# Patient Record
Sex: Male | Born: 1960 | Race: White | Hispanic: No | Marital: Single | Smoking: Current every day smoker
Health system: Southern US, Community
[De-identification: ages and names within clinical notes are randomized; demographics above are authoritative.]

## PROBLEM LIST (undated history)

## (undated) DIAGNOSIS — I10 Essential (primary) hypertension: Secondary | ICD-10-CM

## (undated) DIAGNOSIS — E785 Hyperlipidemia, unspecified: Secondary | ICD-10-CM

## (undated) HISTORY — DX: Essential (primary) hypertension: I10

## (undated) HISTORY — DX: Hyperlipidemia, unspecified: E78.5

---

## 2012-12-24 ENCOUNTER — Ambulatory Visit: Payer: Self-pay | Admitting: Nurse Practitioner

## 2012-12-25 ENCOUNTER — Encounter: Payer: Self-pay | Admitting: Nurse Practitioner

## 2012-12-25 ENCOUNTER — Ambulatory Visit (INDEPENDENT_AMBULATORY_CARE_PROVIDER_SITE_OTHER): Payer: 59 | Admitting: Nurse Practitioner

## 2012-12-25 VITALS — BP 146/84 | HR 71 | Temp 98.3°F | Ht 75.0 in | Wt 175.5 lb

## 2012-12-25 DIAGNOSIS — Z125 Encounter for screening for malignant neoplasm of prostate: Secondary | ICD-10-CM

## 2012-12-25 DIAGNOSIS — I10 Essential (primary) hypertension: Secondary | ICD-10-CM

## 2012-12-25 LAB — COMPLETE METABOLIC PANEL WITH GFR
ALT: 8 U/L (ref 0–53)
AST: 12 U/L (ref 0–37)
Albumin: 4.8 g/dL (ref 3.5–5.2)
Alkaline Phosphatase: 73 U/L (ref 39–117)
Calcium: 9.9 mg/dL (ref 8.4–10.5)
Chloride: 105 mEq/L (ref 96–112)
Potassium: 4.9 mEq/L (ref 3.5–5.3)

## 2012-12-25 MED ORDER — LISINOPRIL 20 MG PO TABS: 20.0000 mg | ORAL_TABLET | Freq: Every day | ORAL | Status: DC

## 2012-12-25 NOTE — Patient Instructions (Signed)

## 2012-12-25 NOTE — Progress Notes (Signed)
  Subjective:    Patient ID: Raymond Blake, male    DOB: 05-08-1961, 52 y.o.   MRN: [id number]  HPI  Patient here for DOT Physical several weeks ago and blood pressure 160 systolic- so DOT only good for 3 months- Here today to get on meds to control blood pressure.    Review of Systems  Constitutional: Negative for fatigue.  Respiratory: Negative for chest tightness and shortness of breath.   Cardiovascular: Negative for chest pain, palpitations and leg swelling.  Gastrointestinal: Negative.   Genitourinary: Negative.        Objective:   Physical Exam  Constitutional: He is oriented to person, place, and time. He appears well-developed and well-nourished.  HENT:  Head: Normocephalic.  Right Ear: External ear normal.  Left Ear: External ear normal.  Nose: Nose normal.  Mouth/Throat: Oropharynx is clear and moist.  Eyes: EOM are normal. Pupils are equal, round, and reactive to light.  Neck: Normal range of motion. Neck supple. No thyromegaly present.  Cardiovascular: Normal rate, regular rhythm, normal heart sounds and intact distal pulses.   No murmur heard. Pulmonary/Chest: Effort normal and breath sounds normal. He has no wheezes. He has no rales.  Abdominal: Soft. Bowel sounds are normal.  Musculoskeletal: Normal range of motion.  Neurological: He is alert and oriented to person, place, and time.  Skin: Skin is warm and dry.  Psychiatric: He has a normal mood and affect. His behavior is normal. Judgment and thought content normal.    BP 146/84  Pulse 71  Temp(Src) 98.3 F (36.8 C) (Oral)  Ht 6\' 3"  (1.905 m)  Wt 175 lb 8 oz (79.606 kg)  BMI 21.94 kg/m2       Assessment & Plan:   1. Hypertension   2. Screening for prostate cancer    Orders Placed This Encounter  Procedures  . COMPLETE METABOLIC PANEL WITH GFR  . NMR Lipoprofile with Lipids  . PSA   Meds ordered this encounter  Medications  . lisinopril (PRINIVIL,ZESTRIL) 20 MG tablet    Sig: Take 1 tablet  (20 mg total) by mouth daily.    Dispense:  30 tablet    Refill:  5    Order Specific Question:  Supervising Provider    Answer:  Ernestina Penna [[id number]]   Diet and exercise encouraged RTO in 2 weeks  Mary-Margaret Daphine Deutscher, FNP

## 2012-12-28 ENCOUNTER — Other Ambulatory Visit: Payer: Self-pay | Admitting: Nurse Practitioner

## 2012-12-28 LAB — NMR LIPOPROFILE WITH LIPIDS
Cholesterol, Total: 195 mg/dL (ref <200)
HDL Particle Number: 40.8 umol/L (ref >=30.5)
LDL (calc): 111 mg/dL — ABNORMAL HIGH (ref <100)
LDL Particle Number: 1622 nmol/L — ABNORMAL HIGH (ref <1000)
LP-IR Score: 53 — ABNORMAL HIGH (ref <=45)
Large VLDL-P: 1.9 nmol/L (ref <=2.7)
Small LDL Particle Number: 829 nmol/L — ABNORMAL HIGH (ref <=527)
Triglycerides: 110 mg/dL (ref <150)
VLDL Size: 46.7 nm — ABNORMAL HIGH (ref <=46.6)

## 2012-12-28 MED ORDER — ATORVASTATIN CALCIUM 40 MG PO TABS: 40.0000 mg | ORAL_TABLET | Freq: Every day | ORAL | Status: DC

## 2012-12-29 ENCOUNTER — Telehealth: Payer: Self-pay | Admitting: Nurse Practitioner

## 2012-12-29 NOTE — Telephone Encounter (Signed)
Patient notified of lab results

## 2013-01-08 ENCOUNTER — Encounter: Payer: Self-pay | Admitting: Nurse Practitioner

## 2013-01-08 ENCOUNTER — Ambulatory Visit (INDEPENDENT_AMBULATORY_CARE_PROVIDER_SITE_OTHER): Payer: 59 | Admitting: Nurse Practitioner

## 2013-01-08 VITALS — BP 118/79 | HR 66 | Temp 98.0°F | Ht 75.0 in | Wt 177.0 lb

## 2013-01-08 DIAGNOSIS — I10 Essential (primary) hypertension: Secondary | ICD-10-CM

## 2013-01-08 NOTE — Progress Notes (Addendum)
  Subjective:    Patient ID: Raymond Blake, male    DOB: [birth year], 52 y.o.   MRN: [id number]  HPI Patient here today for follow up of blood pressure. He had a DOT physical a few weeks back and  Was found to have high blood pressure. Was recently started on lisinopril 20 mg and is doing quite well.   Review of Systems  All other systems reviewed and are negative.       Objective:   Physical Exam  Constitutional: He is oriented to person, place, and time. He appears well-developed and well-nourished.  Cardiovascular: Normal rate, normal heart sounds and intact distal pulses.   Pulmonary/Chest: Effort normal and breath sounds normal.  Neurological: He is alert and oriented to person, place, and time.          Assessment & Plan:   1. Hypertension    Current Outpatient Prescriptions on File Prior to Visit  Medication Sig Dispense Refill  . atorvastatin (LIPITOR) 40 MG tablet Take 1 tablet (40 mg total) by mouth daily.  30 tablet  3  . lisinopril (PRINIVIL,ZESTRIL) 20 MG tablet Take 1 tablet (20 mg total) by mouth daily.  30 tablet  5   No current facility-administered medications on file prior to visit.   Continue all meds  Labs pending Diet and exercise Follow up in 3 months  Mary-Margaret Daphine Deutscher, FNP

## 2013-01-08 NOTE — Patient Instructions (Signed)

## 2013-02-24 ENCOUNTER — Encounter: Payer: Self-pay | Admitting: *Deleted

## 2013-04-08 ENCOUNTER — Telehealth: Payer: Self-pay | Admitting: Nurse Practitioner

## 2013-04-08 MED ORDER — VALSARTAN 160 MG PO TABS: 160.0000 mg | ORAL_TABLET | Freq: Every day | ORAL | Status: DC

## 2013-04-08 NOTE — Telephone Encounter (Signed)
Patient states it is causing him to cough and he cant stand to go out in  [place] sun he has an appointment in December can he wait till then or does he need to come in sooner?

## 2013-04-08 NOTE — Telephone Encounter (Signed)
ntbs

## 2013-04-08 NOTE — Telephone Encounter (Signed)
Changed to diovan

## 2013-04-09 NOTE — Telephone Encounter (Signed)
Patient aware.

## 2013-07-05 ENCOUNTER — Encounter: Payer: Self-pay | Admitting: Nurse Practitioner

## 2013-07-05 ENCOUNTER — Ambulatory Visit (INDEPENDENT_AMBULATORY_CARE_PROVIDER_SITE_OTHER): Payer: 59 | Admitting: Nurse Practitioner

## 2013-07-05 VITALS — BP 146/81 | HR 70 | Temp 99.1°F | Ht 75.0 in | Wt 185.0 lb

## 2013-07-05 DIAGNOSIS — E785 Hyperlipidemia, unspecified: Secondary | ICD-10-CM

## 2013-07-05 DIAGNOSIS — I10 Essential (primary) hypertension: Secondary | ICD-10-CM

## 2013-07-05 MED ORDER — LOSARTAN POTASSIUM-HCTZ 100-12.5 MG PO TABS: 1.0000 | ORAL_TABLET | Freq: Every day | ORAL | Status: DC

## 2013-07-05 NOTE — Progress Notes (Signed)
   Subjective:    Patient ID: Raymond Blake, male    DOB: 1961/07/09, 52 y.o.   MRN: [id number]  Hyperlipidemia This is a chronic problem. The current episode started more than 1 year ago. The problem is uncontrolled. Recent lipid tests were reviewed and are high. He has no history of diabetes, hypothyroidism, liver disease, obesity or nephrotic syndrome. There are no known factors aggravating his hyperlipidemia. Pertinent negatives include no chest pain, focal sensory loss, focal weakness, leg pain, myalgias or shortness of breath. Current antihyperlipidemic treatment includes statins. The current treatment provides moderate improvement of lipids. Compliance problems include adherence to diet.  Risk factors for coronary artery disease include dyslipidemia, hypertension and male sex.  Hypertension This is a chronic problem. The current episode started more than 1 year ago. The problem is unchanged. The problem is controlled. Pertinent negatives include no chest pain, neck pain, palpitations, peripheral edema, shortness of breath or sweats. There are no associated agents to hypertension. Risk factors for coronary artery disease include dyslipidemia and male gender. Past treatments include angiotensin blockers. Compliance problems include exercise.    * diovan very expensive would like something cheaper if possible   Review of Systems  Respiratory: Negative for shortness of breath.   Cardiovascular: Negative for chest pain and palpitations.  Musculoskeletal: Negative for myalgias and neck pain.  Neurological: Negative for focal weakness.       Objective:   Physical Exam  Constitutional: He is oriented to person, place, and time. He appears well-developed and well-nourished.  HENT:  Head: Normocephalic.  Right Ear: External ear normal.  Left Ear: External ear normal.  Nose: Nose normal.  Mouth/Throat: Oropharynx is clear and moist.  Eyes: EOM are normal. Pupils are equal, round, and reactive  to light.  Neck: Normal range of motion. Neck supple. No JVD present. No thyromegaly present.  Cardiovascular: Normal rate, regular rhythm, normal heart sounds and intact distal pulses.  Exam reveals no gallop and no friction rub.   No murmur heard. Pulmonary/Chest: Effort normal and breath sounds normal. No respiratory distress. He has no wheezes. He has no rales. He exhibits no tenderness.  Abdominal: Soft. Bowel sounds are normal. He exhibits no mass. There is no tenderness.  Musculoskeletal: Normal range of motion. He exhibits no edema.  Lymphadenopathy:    He has no cervical adenopathy.  Neurological: He is alert and oriented to person, place, and time. No cranial nerve deficit.  Skin: Skin is warm and dry.  Psychiatric: He has a normal mood and affect. His behavior is normal. Judgment and thought content normal.   BP 146/81  Pulse 70  Temp(Src) 99.1 F (37.3 C) (Oral)  Ht 6\' 3"  (1.905 m)  Wt 185 lb (83.915 kg)  BMI 23.12 kg/m2        Assessment & Plan:   1. Hypertension   2. Hyperlipidemia    Orders Placed This Encounter  Procedures  . CMP14+EGFR  . NMR, lipoprofile   Meds ordered this encounter  Medications  . losartan-hydrochlorothiazide (HYZAAR) 100-12.5 MG per tablet    Sig: Take 1 tablet by mouth daily.    Dispense:  90 tablet    Refill:  1    Order Specific Question:  Supervising Provider    Answer:  Deborra Medina    Continue all meds Labs pending Diet and exercise encouraged Health maintenance reviewed Follow up in 3 month   Mary-Margaret Daphine Deutscher, FNP

## 2013-07-05 NOTE — Patient Instructions (Signed)

## 2013-07-06 LAB — CMP14+EGFR
Albumin: 4.8 g/dL (ref 3.5–5.5)
Alkaline Phosphatase: 73 IU/L (ref 39–117)
BUN/Creatinine Ratio: 11 (ref 9–20)
BUN: 11 mg/dL (ref 6–24)
CO2: 25 mmol/L (ref 18–29)
Chloride: 100 mmol/L (ref 97–108)
Creatinine, Ser: 1.01 mg/dL (ref 0.76–1.27)
Globulin, Total: 2.3 g/dL (ref 1.5–4.5)
Glucose: 77 mg/dL (ref 65–99)
Total Protein: 7.1 g/dL (ref 6.0–8.5)

## 2013-07-06 LAB — NMR, LIPOPROFILE
Cholesterol: 215 mg/dL — ABNORMAL HIGH (ref <200)
HDL Cholesterol by NMR: 65 mg/dL (ref >=40)
HDL Particle Number: 43.8 umol/L (ref >=30.5)
LDL Particle Number: 1687 nmol/L — ABNORMAL HIGH (ref <1000)
LDLC SERPL CALC-MCNC: 122 mg/dL — ABNORMAL HIGH (ref <100)
Triglycerides by NMR: 141 mg/dL (ref <150)

## 2013-07-12 ENCOUNTER — Ambulatory Visit: Payer: 59 | Admitting: Nurse Practitioner

## 2013-07-30 ENCOUNTER — Telehealth: Payer: Self-pay | Admitting: Nurse Practitioner

## 2013-07-30 NOTE — Telephone Encounter (Signed)
[facility]

## 2013-08-03 ENCOUNTER — Ambulatory Visit (INDEPENDENT_AMBULATORY_CARE_PROVIDER_SITE_OTHER): Payer: 59 | Admitting: Nurse Practitioner

## 2013-08-03 ENCOUNTER — Encounter: Payer: Self-pay | Admitting: Nurse Practitioner

## 2013-08-03 VITALS — BP 164/89 | HR 72 | Temp 99.1°F | Ht 75.0 in | Wt 186.0 lb

## 2013-08-03 DIAGNOSIS — F32A Depression, unspecified: Secondary | ICD-10-CM

## 2013-08-03 DIAGNOSIS — F411 Generalized anxiety disorder: Secondary | ICD-10-CM

## 2013-08-03 DIAGNOSIS — F329 Major depressive disorder, single episode, unspecified: Secondary | ICD-10-CM

## 2013-08-03 DIAGNOSIS — F3289 Other specified depressive episodes: Secondary | ICD-10-CM

## 2013-08-03 MED ORDER — ESCITALOPRAM OXALATE 10 MG PO TABS: 10.0000 mg | ORAL_TABLET | Freq: Every day | ORAL | Status: DC

## 2013-08-03 MED ORDER — CLONAZEPAM 0.5 MG PO TABS: 0.5000 mg | ORAL_TABLET | Freq: Two times a day (BID) | ORAL | Status: DC | PRN

## 2013-08-03 NOTE — Patient Instructions (Signed)
Stress Management Stress is a state of physical or mental tension that often results from changes in your life or normal routine. Some common causes of stress are:  Death of a loved one.  Injuries or severe illnesses.  Getting fired or changing jobs.  Moving into a new home. Other causes may be:  Sexual problems.  Business or financial losses.  Taking on a large debt.  Regular conflict with someone at home or at work.  Constant tiredness from lack of sleep. It is not just bad things that are stressful. It may be stressful to:  Win the lottery.  Get married.  Buy a new car. The amount of stress that can be easily tolerated varies from person to person. Changes generally cause stress, regardless of the types of change. Too much stress can affect your health. It may lead to physical or emotional problems. Too little stress (boredom) may also become stressful. SUGGESTIONS TO REDUCE STRESS:  Talk things over with your family and friends. It often is helpful to share your concerns and worries. If you feel your problem is serious, you may want to get help from a professional counselor.  Consider your problems one at a time instead of lumping them all together. Trying to take care of everything at once may seem impossible. List all the things you need to do and then start with the most important one. Set a goal to accomplish 2 or 3 things each day. If you expect to do too many in a single day you will naturally fail, causing you to feel even more stressed.  Do not use alcohol or drugs to relieve stress. Although you may feel better for a short time, they do not remove the problems that caused the stress. They can also be habit forming.  Exercise regularly - at least 3 times per week. Physical exercise can help to relieve that "uptight" feeling and will relax you.  The shortest distance between despair and hope is often a good night's sleep.  Go to bed and get up on time allowing  yourself time for appointments without being rushed.  Take a short "time-out" period from any stressful situation that occurs during the day. Close your eyes and take some deep breaths. Starting with the muscles in your face, tense them, hold it for a few seconds, then relax. Repeat this with the muscles in your neck, shoulders, hand, stomach, back and legs.  Take good care of yourself. Eat a balanced diet and get plenty of rest.  Schedule time for having fun. Take a break from your daily routine to relax. HOME CARE INSTRUCTIONS   Call if you feel overwhelmed by your problems and feel you can no longer manage them on your own.  Return immediately if you feel like hurting yourself or someone else. Document Released: 12/25/2000 Document Revised: 09/23/2011 Document Reviewed: 02/23/2013 [employer] Patient Information 2014 [employer].  

## 2013-08-03 NOTE — Progress Notes (Signed)
   Subjective:    Patient ID: Raymond Blake, male    DOB: 07-03-1961, 53 y.o.   MRN: [id number]  HPI Having significant panic attacks, increased anxiety, difficulty in personal relationships related to [place] for the last year.  Missed 7 days of work related to anxiety in the last month.  Feels increased stress is related to work.  Associated manifestations include racing thoughts, palpitations.  Denies SOB except during hyperventilation with panic attack. Attempted to self-medicate with alcohol, but it only intensified anxiety.  Has increased smoking from one to three packs per day.     Review of Systems  Constitutional: Positive for activity change, appetite change and fatigue.  Eyes: Negative.   Respiratory: Negative.   Cardiovascular: Positive for palpitations.  Gastrointestinal: Negative.   Endocrine: Negative.   Genitourinary: Negative.   Musculoskeletal: Negative.   Skin: Negative.   Neurological: Positive for headaches. Negative for dizziness.  Hematological: Negative.   Psychiatric/Behavioral: Positive for sleep disturbance, decreased concentration and agitation. Negative for suicidal ideas, hallucinations and self-injury. The patient is nervous/anxious and is hyperactive.        Objective:   Physical Exam  Constitutional: He is oriented to person, place, and time. He appears well-developed and well-nourished.  Cardiovascular: Normal rate, regular rhythm and normal heart sounds.   Pulmonary/Chest: Effort normal and breath sounds normal.  Neurological: He is alert and oriented to person, place, and time.  Psychiatric: His behavior is normal. Judgment normal. His mood appears anxious. His affect is labile. Cognition and memory are normal. He exhibits a depressed mood.    BP 164/89  Pulse 72  Temp(Src) 99.1 F (37.3 C) (Oral)  Ht 6\' 3"  (1.905 m)  Wt 186 lb (84.369 kg)  BMI 23.25 kg/m2       Assessment & Plan:   1. Depression   2. GAD (generalized anxiety disorder)     Meds ordered this encounter  Medications  . escitalopram (LEXAPRO) 10 MG tablet    Sig: Take 1 tablet (10 mg total) by mouth daily.    Dispense:  30 tablet    Refill:  3    Order Specific Question:  Supervising Provider    Answer:  Ernestina PennaMOORE, DONALD W [[id number]]  . clonazePAM (KLONOPIN) 0.5 MG tablet    Sig: Take 1 tablet (0.5 mg total) by mouth 2 (two) times daily as needed for anxiety.    Dispense:  60 tablet    Refill:  0    Order Specific Question:  Supervising Provider    Answer:  Ernestina PennaMOORE, DONALD W [[id number]]   Stress management Exercise Follow up in 3 weeks  Mary-Margaret Daphine DeutscherMartin, FNP

## 2013-08-06 ENCOUNTER — Ambulatory Visit: Payer: 59 | Admitting: Nurse Practitioner

## 2013-08-25 ENCOUNTER — Encounter: Payer: Self-pay | Admitting: Nurse Practitioner

## 2013-08-25 ENCOUNTER — Encounter (INDEPENDENT_AMBULATORY_CARE_PROVIDER_SITE_OTHER): Payer: Self-pay

## 2013-08-25 ENCOUNTER — Ambulatory Visit (INDEPENDENT_AMBULATORY_CARE_PROVIDER_SITE_OTHER): Payer: 59 | Admitting: Nurse Practitioner

## 2013-08-25 VITALS — BP 104/68 | HR 67 | Temp 98.4°F | Ht 75.0 in | Wt 186.0 lb

## 2013-08-25 DIAGNOSIS — F3289 Other specified depressive episodes: Secondary | ICD-10-CM

## 2013-08-25 DIAGNOSIS — F329 Major depressive disorder, single episode, unspecified: Secondary | ICD-10-CM | POA: Insufficient documentation

## 2013-08-25 DIAGNOSIS — F32A Depression, unspecified: Secondary | ICD-10-CM | POA: Insufficient documentation

## 2013-08-25 MED ORDER — CITALOPRAM HYDROBROMIDE 20 MG PO TABS
20.0000 mg | ORAL_TABLET | Freq: Every day | ORAL | Status: DC
Start: 2013-08-25 — End: 2013-12-21

## 2013-08-25 NOTE — Patient Instructions (Signed)
Stress Management Stress is a state of physical or mental tension that often results from changes in your life or normal routine. Some common causes of stress are:  Death of a loved one.  Injuries or severe illnesses.  Getting fired or changing jobs.  Moving into a new home. Other causes may be:  Sexual problems.  Business or financial losses.  Taking on a large debt.  Regular conflict with someone at home or at work.  Constant tiredness from lack of sleep. It is not just bad things that are stressful. It may be stressful to:  Win the lottery.  Get married.  Buy a new car. The amount of stress that can be easily tolerated varies from person to person. Changes generally cause stress, regardless of the types of change. Too much stress can affect your health. It may lead to physical or emotional problems. Too little stress (boredom) may also become stressful. SUGGESTIONS TO REDUCE STRESS:  Talk things over with your family and friends. It often is helpful to share your concerns and worries. If you feel your problem is serious, you may want to get help from a professional counselor.  Consider your problems one at a time instead of lumping them all together. Trying to take care of everything at once may seem impossible. List all the things you need to do and then start with the most important one. Set a goal to accomplish 2 or 3 things each day. If you expect to do too many in a single day you will naturally fail, causing you to feel even more stressed.  Do not use alcohol or drugs to relieve stress. Although you may feel better for a short time, they do not remove the problems that caused the stress. They can also be habit forming.  Exercise regularly - at least 3 times per week. Physical exercise can help to relieve that "uptight" feeling and will relax you.  The shortest distance between despair and hope is often a good night's sleep.  Go to bed and get up on time allowing  yourself time for appointments without being rushed.  Take a short "time-out" period from any stressful situation that occurs during the day. Close your eyes and take some deep breaths. Starting with the muscles in your face, tense them, hold it for a few seconds, then relax. Repeat this with the muscles in your neck, shoulders, hand, stomach, back and legs.  Take good care of yourself. Eat a balanced diet and get plenty of rest.  Schedule time for having fun. Take a break from your daily routine to relax. HOME CARE INSTRUCTIONS   Call if you feel overwhelmed by your problems and feel you can no longer manage them on your own.  Return immediately if you feel like hurting yourself or someone else. Document Released: 12/25/2000 Document Revised: 09/23/2011 Document Reviewed: 02/23/2013 [employer] Patient Information 2014 [employer].  

## 2013-08-25 NOTE — Progress Notes (Signed)
   Subjective:    Patient ID: Raymond Blake, male    DOB: 05-28-1961, 53 y.o.   MRN: [id number]  HPIPatient here today for recheck- He was seen in office 3 weeks ago with depression and anxiety- He was started on lexapro and was given a short course of klonopin- He says that he is doing much better. Feels much calmer and least anxious but does c/o sexual side effects.    Review of Systems  Constitutional: Negative.   HENT: Negative.   Respiratory: Negative.   Cardiovascular: Negative.   Psychiatric/Behavioral: Negative.   All other systems reviewed and are negative.       Objective:   Physical Exam  Constitutional: He is oriented to person, place, and time. He appears well-developed and well-nourished.  Cardiovascular: Normal rate, regular rhythm and normal heart sounds.   Pulmonary/Chest: Effort normal and breath sounds normal.  Neurological: He is alert and oriented to person, place, and time.  Skin: Skin is warm.  Psychiatric: He has a normal mood and affect. His behavior is normal. Judgment and thought content normal.   BP 104/68  Pulse 67  Temp(Src) 98.4 F (36.9 C) (Oral)  Ht 6\' 3"  (1.905 m)  Wt 186 lb (84.369 kg)  BMI 23.25 kg/m2        Assessment & Plan:   1. Depression    Meds ordered this encounter  Medications  . citalopram (CELEXA) 20 MG tablet    Sig: Take 1 tablet (20 mg total) by mouth daily.    Dispense:  30 tablet    Refill:  3    Order Specific Question:  Supervising Provider    Answer:  Ernestina PennaMOORE, DONALD W [[id number]]   Stress management Exercise  Mary-Margaret Daphine DeutscherMartin, FNP

## 2013-09-20 ENCOUNTER — Other Ambulatory Visit: Payer: Self-pay | Admitting: Nurse Practitioner

## 2013-09-21 NOTE — Telephone Encounter (Signed)
Please call in xanax with 1 refills 

## 2013-09-21 NOTE — Telephone Encounter (Signed)
Last seen 2/11/5  MMM IF approved route to nurse to call into [facility]

## 2013-09-22 NOTE — Telephone Encounter (Signed)
rx called into pharmacy

## 2013-10-04 ENCOUNTER — Ambulatory Visit: Payer: 59 | Admitting: Nurse Practitioner

## 2013-10-20 ENCOUNTER — Ambulatory Visit (INDEPENDENT_AMBULATORY_CARE_PROVIDER_SITE_OTHER): Payer: 59 | Admitting: Nurse Practitioner

## 2013-10-20 ENCOUNTER — Encounter: Payer: Self-pay | Admitting: Nurse Practitioner

## 2013-10-20 VITALS — BP 117/80 | HR 86 | Temp 99.5°F | Ht 75.0 in | Wt 191.8 lb

## 2013-10-20 DIAGNOSIS — F32A Depression, unspecified: Secondary | ICD-10-CM

## 2013-10-20 DIAGNOSIS — F329 Major depressive disorder, single episode, unspecified: Secondary | ICD-10-CM

## 2013-10-20 DIAGNOSIS — F3289 Other specified depressive episodes: Secondary | ICD-10-CM

## 2013-10-20 DIAGNOSIS — I10 Essential (primary) hypertension: Secondary | ICD-10-CM

## 2013-10-20 DIAGNOSIS — E785 Hyperlipidemia, unspecified: Secondary | ICD-10-CM

## 2013-10-20 MED ORDER — SILDENAFIL CITRATE 50 MG PO TABS: 50.0000 mg | ORAL_TABLET | Freq: Every day | ORAL | Status: DC | PRN

## 2013-10-20 MED ORDER — CLONAZEPAM 0.5 MG PO TABS: 0.5000 mg | ORAL_TABLET | Freq: Two times a day (BID) | ORAL | Status: DC

## 2013-10-20 NOTE — Progress Notes (Signed)
Subjective:    Patient ID: Raymond Blake, male    DOB: January 30, 1961, 53 y.o.   MRN: [id number]  Patient here today for follow up of chronic medical problems.   Hyperlipidemia This is a chronic problem. The current episode started more than 1 year ago. The problem is uncontrolled. Recent lipid tests were reviewed and are high. He has no history of diabetes, hypothyroidism, liver disease, obesity or nephrotic syndrome. There are no known factors aggravating his hyperlipidemia. Pertinent negatives include no chest pain, focal sensory loss, focal weakness, leg pain, myalgias or shortness of breath. Current antihyperlipidemic treatment includes statins. The current treatment provides moderate improvement of lipids. Compliance problems include adherence to diet.  Risk factors for coronary artery disease include dyslipidemia, hypertension and male sex.  Hypertension This is a chronic problem. The current episode started more than 1 year ago. The problem is unchanged. The problem is controlled. Pertinent negatives include no chest pain, neck pain, palpitations, peripheral edema, shortness of breath or sweats. There are no associated agents to hypertension. Risk factors for coronary artery disease include dyslipidemia and male gender. Past treatments include angiotensin blockers. Compliance problems include exercise.   Depression Patient on celexa and says that it is working well. Having problems with erection but was having those problems prior to starting celexa.   Review of Systems  Respiratory: Negative for shortness of breath.   Cardiovascular: Negative for chest pain and palpitations.  Musculoskeletal: Negative for myalgias and neck pain.  Neurological: Negative for focal weakness.       Objective:   Physical Exam  Constitutional: He is oriented to person, place, and time. He appears well-developed and well-nourished.  HENT:  Head: Normocephalic.  Right Ear: External ear normal.  Left Ear:  External ear normal.  Nose: Nose normal.  Mouth/Throat: Oropharynx is clear and moist.  Eyes: EOM are normal. Pupils are equal, round, and reactive to light.  Neck: Normal range of motion. Neck supple. No JVD present. No thyromegaly present.  Cardiovascular: Normal rate, regular rhythm, normal heart sounds and intact distal pulses.  Exam reveals no gallop and no friction rub.   No murmur heard. Pulmonary/Chest: Effort normal and breath sounds normal. No respiratory distress. He has no wheezes. He has no rales. He exhibits no tenderness.  Abdominal: Soft. Bowel sounds are normal. He exhibits no mass. There is no tenderness.  Musculoskeletal: Normal range of motion. He exhibits no edema.  Lymphadenopathy:    He has no cervical adenopathy.  Neurological: He is alert and oriented to person, place, and time. No cranial nerve deficit.  Skin: Skin is warm and dry.  Psychiatric: He has a normal mood and affect. His behavior is normal. Judgment and thought content normal.   BP 117/80  Pulse 86  Temp(Src) 99.5 F (37.5 C) (Oral)  Ht 6' 3"  (1.905 m)  Wt 191 lb 12.8 oz (87 kg)  BMI 23.97 kg/m2        Assessment & Plan:   1. Hypertension   2. Hyperlipidemia   3. Depression    Orders Placed This Encounter  Procedures  . CMP14+EGFR  . NMR, lipoprofile   Meds ordered this encounter  Medications  . sildenafil (VIAGRA) 50 MG tablet    Sig: Take 1 tablet (50 mg total) by mouth daily as needed for erectile dysfunction.    Dispense:  8 tablet    Refill:  0    Order Specific Question:  Supervising Provider    Answer:  Chipper Herb [  [id number]]  . clonazePAM (KLONOPIN) 0.5 MG tablet    Sig: Take 1 tablet (0.5 mg total) by mouth 2 (two) times daily.    Dispense:  60 tablet    Refill:  1    Order Specific Question:  Supervising Provider    Answer:  Chipper Herb [[id number]]    Labs pending Health maintenance reviewed Diet and exercise encouraged Continue all meds Follow up  In 3 months    [place], FNP

## 2013-10-20 NOTE — Patient Instructions (Signed)

## 2013-10-22 LAB — CMP14+EGFR
A/G RATIO: 2.1 (ref 1.1–2.5)
ALT: 12 IU/L (ref 0–44)
AST: 17 IU/L (ref 0–40)
Albumin: 4.9 g/dL (ref 3.5–5.5)
Alkaline Phosphatase: 75 IU/L (ref 39–117)
BUN / CREAT RATIO: 10 (ref 9–20)
BUN: 10 mg/dL (ref 6–24)
CO2: 27 mmol/L (ref 18–29)
Calcium: 10.3 mg/dL — ABNORMAL HIGH (ref 8.7–10.2)
Chloride: 98 mmol/L (ref 97–108)
Creatinine, Ser: 1.04 mg/dL (ref 0.76–1.27)
GFR calc Af Amer: 96 mL/min/{1.73_m2} (ref >59)
GFR, EST NON AFRICAN AMERICAN: 83 mL/min/{1.73_m2} (ref >59)
GLUCOSE: 90 mg/dL (ref 65–99)
Globulin, Total: 2.3 g/dL (ref 1.5–4.5)
Potassium: 4.7 mmol/L (ref 3.5–5.2)
Sodium: 140 mmol/L (ref 134–144)
TOTAL PROTEIN: 7.2 g/dL (ref 6.0–8.5)
Total Bilirubin: 0.5 mg/dL (ref 0.0–1.2)

## 2013-10-22 LAB — NMR, LIPOPROFILE
Cholesterol: 213 mg/dL — ABNORMAL HIGH (ref <200)
HDL Cholesterol by NMR: 72 mg/dL (ref >=40)
HDL PARTICLE NUMBER: 50.5 umol/L (ref >=30.5)
LDL Particle Number: 1392 nmol/L — ABNORMAL HIGH (ref <1000)
LDL Size: 21.4 nm (ref >20.5)
LDLC SERPL CALC-MCNC: 111 mg/dL — ABNORMAL HIGH (ref <100)
LP-IR Score: 40 (ref <=45)
SMALL LDL PARTICLE NUMBER: 457 nmol/L (ref <=527)
TRIGLYCERIDES BY NMR: 149 mg/dL (ref <150)

## 2013-12-21 ENCOUNTER — Other Ambulatory Visit: Payer: Self-pay | Admitting: Nurse Practitioner

## 2014-01-25 ENCOUNTER — Other Ambulatory Visit: Payer: Self-pay | Admitting: Nurse Practitioner

## 2014-01-27 ENCOUNTER — Other Ambulatory Visit: Payer: Self-pay | Admitting: Nurse Practitioner

## 2014-01-28 NOTE — Telephone Encounter (Signed)
Phoned in.

## 2014-01-28 NOTE — Telephone Encounter (Signed)
Please call in klonopin with 0 refills 

## 2014-01-28 NOTE — Telephone Encounter (Signed)
Patient last seen in office on 10-20-13. Rx last refilled on 12-30-13 for #60. Please advise. If approved please route to Pool B so nurse can phone in to pharmacy

## 2014-02-02 ENCOUNTER — Encounter: Payer: Self-pay | Admitting: Nurse Practitioner

## 2014-02-02 ENCOUNTER — Ambulatory Visit (INDEPENDENT_AMBULATORY_CARE_PROVIDER_SITE_OTHER): Payer: 59 | Admitting: Nurse Practitioner

## 2014-02-02 VITALS — BP 140/90 | HR 72 | Temp 98.8°F | Ht 75.0 in | Wt 180.6 lb

## 2014-02-02 DIAGNOSIS — F32A Depression, unspecified: Secondary | ICD-10-CM

## 2014-02-02 DIAGNOSIS — F3289 Other specified depressive episodes: Secondary | ICD-10-CM

## 2014-02-02 DIAGNOSIS — Z125 Encounter for screening for malignant neoplasm of prostate: Secondary | ICD-10-CM

## 2014-02-02 DIAGNOSIS — E785 Hyperlipidemia, unspecified: Secondary | ICD-10-CM

## 2014-02-02 DIAGNOSIS — F329 Major depressive disorder, single episode, unspecified: Secondary | ICD-10-CM

## 2014-02-02 DIAGNOSIS — I10 Essential (primary) hypertension: Secondary | ICD-10-CM

## 2014-02-02 MED ORDER — VALSARTAN 160 MG PO TABS: 160.0000 mg | ORAL_TABLET | Freq: Every day | ORAL | Status: AC

## 2014-02-02 MED ORDER — CLONAZEPAM 1 MG PO TABS: 1.0000 mg | ORAL_TABLET | Freq: Two times a day (BID) | ORAL | Status: DC | PRN

## 2014-02-02 MED ORDER — SILDENAFIL CITRATE 50 MG PO TABS: 100.0000 mg | ORAL_TABLET | Freq: Every day | ORAL | Status: AC | PRN

## 2014-02-02 NOTE — Progress Notes (Signed)
Subjective:    Patient ID: Raymond Blake, male    DOB: [birth year], 53 y.o.   MRN: [id number]  Patient here today for follow up of chronic medical problems.   Hyperlipidemia This is a chronic problem. The current episode started more than 1 year ago. The problem is uncontrolled. Recent lipid tests were reviewed and are high. He has no history of diabetes, hypothyroidism, liver disease, obesity or nephrotic syndrome. There are no known factors aggravating his hyperlipidemia. Pertinent negatives include no chest pain, focal sensory loss, focal weakness, leg pain, myalgias or shortness of breath. Current antihyperlipidemic treatment includes statins (has been out of lipitor for several months.). The current treatment provides moderate improvement of lipids. Compliance problems include adherence to diet.  Risk factors for coronary artery disease include dyslipidemia, hypertension and male sex.  Hypertension This is a chronic problem. The current episode started more than 1 year ago. The problem is unchanged. The problem is uncontrolled. Pertinent negatives include no chest pain, neck pain, palpitations, peripheral edema, shortness of breath or sweats. There are no associated agents to hypertension. Risk factors for coronary artery disease include dyslipidemia and male gender. Past treatments include angiotensin blockers (HCTZ makes him go to bathroom all the time- would like to go back on diovan he was n in the past.). Compliance problems include exercise.   Depression Patient on celexa and says that he does not need anymore. Raymond Blake been taking klonopin BID but has been taking 1 1/2 BID- really helps with his stress and makes hi a more likeable person.   Review of Systems  Respiratory: Negative for shortness of breath.   Cardiovascular: Negative for chest pain and palpitations.  Musculoskeletal: Negative for myalgias and neck pain.  Neurological: Negative for focal weakness.       Objective:   Physical Exam  Constitutional: He is oriented to person, place, and time. He appears well-developed and well-nourished.  HENT:  Head: Normocephalic.  Right Ear: External ear normal.  Left Ear: External ear normal.  Nose: Nose normal.  Mouth/Throat: Oropharynx is clear and moist.  Eyes: EOM are normal. Pupils are equal, round, and reactive to light.  Neck: Normal range of motion. Neck supple. No JVD present. No thyromegaly present.  Cardiovascular: Normal rate, regular rhythm, normal heart sounds and intact distal pulses.  Exam reveals no gallop and no friction rub.   No murmur heard. Pulmonary/Chest: Effort normal and breath sounds normal. No respiratory distress. He has no wheezes. He has no rales. He exhibits no tenderness.  Abdominal: Soft. Bowel sounds are normal. He exhibits no mass. There is no tenderness.  Musculoskeletal: Normal range of motion. He exhibits no edema.  Lymphadenopathy:    He has no cervical adenopathy.  Neurological: He is alert and oriented to person, place, and time. No cranial nerve deficit.  Skin: Skin is warm and dry.  Psychiatric: He has a normal mood and affect. His behavior is normal. Judgment and thought content normal.   BP 140/90  Pulse 72  Temp(Src) 98.8 F (37.1 C) (Oral)  Ht 6\' 3"  (1.905 m)  Wt 180 lb 9.6 oz (81.92 kg)  BMI 22.57 kg/m2        Assessment & Plan:   1. Essential hypertension   2. Hyperlipidemia   3. Depression    No orders of the defined types were placed in this encounter.   Meds ordered this encounter  Medications  . valsartan (DIOVAN) 160 MG tablet    Sig: Take 1 tablet (160  mg total) by mouth daily.    Dispense:  30 tablet    Refill:  5    Order Specific Question:  Supervising Provider    Answer:  Ernestina PennaMOORE, DONALD W [[id number]]  . sildenafil (VIAGRA) 50 MG tablet    Sig: Take 2 tablets (100 mg total) by mouth daily as needed for erectile dysfunction.    Dispense:  9 tablet    Refill:  11    Order Specific Question:   Supervising Provider    Answer:  Ernestina PennaMOORE, DONALD W [[id number]]    Order Specific Question:  Lot Number?    Answer:  [id number]    Order Specific Question:  Expiration Date?    Answer:  12/20/2017    Order Specific Question:  Manufacturer?    Answer:  [employer] [[id number]]    Order Specific Question:  NDC    Answer:  [phone] [[id number]]    Order Specific Question:  Quantity    Answer:  8  . clonazePAM (KLONOPIN) 1 MG tablet    Sig: Take 1 tablet (1 mg total) by mouth 2 (two) times daily as needed for anxiety.    Dispense:  60 tablet    Refill:  1    Order Specific Question:  Supervising Provider    Answer:  Deborra MedinaMOORE, DONALD W [[id number]]   Will discuss colonoscopy at next visit Labs pending Health maintenance reviewed Diet and exercise encouraged Continue all meds Follow up  In 3 months    Raymond Daphine DeutscherMartin, FNP

## 2014-02-02 NOTE — Patient Instructions (Signed)

## 2014-02-03 LAB — NMR, LIPOPROFILE
Cholesterol: 218 mg/dL — ABNORMAL HIGH (ref 100–199)
HDL Cholesterol by NMR: 82 mg/dL (ref >39)
HDL Particle Number: 46.4 umol/L (ref >=30.5)
LDL PARTICLE NUMBER: 978 nmol/L (ref <1000)
LDL SIZE: 21.3 nm (ref >20.5)
LDLC SERPL CALC-MCNC: 114 mg/dL — ABNORMAL HIGH (ref 0–99)
LP-IR SCORE: 36 (ref <=45)
SMALL LDL PARTICLE NUMBER: 380 nmol/L (ref <=527)
TRIGLYCERIDES BY NMR: 111 mg/dL (ref 0–149)

## 2014-02-03 LAB — PSA, TOTAL AND FREE
PSA FREE PCT: 11.4 %
PSA, Free: 0.16 ng/mL
PSA: 1.4 ng/mL (ref 0.0–4.0)

## 2014-02-03 LAB — CMP14+EGFR
A/G RATIO: 2.3 (ref 1.1–2.5)
ALK PHOS: 81 IU/L (ref 39–117)
ALT: 12 IU/L (ref 0–44)
AST: 16 IU/L (ref 0–40)
Albumin: 5.2 g/dL (ref 3.5–5.5)
BILIRUBIN TOTAL: 0.7 mg/dL (ref 0.0–1.2)
BUN / CREAT RATIO: 8 — AB (ref 9–20)
BUN: 9 mg/dL (ref 6–24)
CHLORIDE: 96 mmol/L — AB (ref 97–108)
CO2: 23 mmol/L (ref 18–29)
Calcium: 10.3 mg/dL — ABNORMAL HIGH (ref 8.7–10.2)
Creatinine, Ser: 1.08 mg/dL (ref 0.76–1.27)
GFR, EST AFRICAN AMERICAN: 92 mL/min/{1.73_m2} (ref >59)
GFR, EST NON AFRICAN AMERICAN: 80 mL/min/{1.73_m2} (ref >59)
Globulin, Total: 2.3 g/dL (ref 1.5–4.5)
Glucose: 89 mg/dL (ref 65–99)
POTASSIUM: 4.8 mmol/L (ref 3.5–5.2)
Sodium: 139 mmol/L (ref 134–144)
Total Protein: 7.5 g/dL (ref 6.0–8.5)

## 2014-02-24 ENCOUNTER — Other Ambulatory Visit: Payer: Self-pay | Admitting: Nurse Practitioner

## 2014-03-24 ENCOUNTER — Telehealth: Payer: Self-pay | Admitting: Family Medicine

## 2014-03-24 ENCOUNTER — Encounter: Payer: Self-pay | Admitting: Family Medicine

## 2014-03-24 ENCOUNTER — Encounter (INDEPENDENT_AMBULATORY_CARE_PROVIDER_SITE_OTHER): Payer: Self-pay

## 2014-03-24 ENCOUNTER — Ambulatory Visit (INDEPENDENT_AMBULATORY_CARE_PROVIDER_SITE_OTHER): Payer: 59 | Admitting: Family Medicine

## 2014-03-24 ENCOUNTER — Ambulatory Visit (INDEPENDENT_AMBULATORY_CARE_PROVIDER_SITE_OTHER): Payer: 59

## 2014-03-24 VITALS — BP 142/82 | HR 60 | Temp 98.5°F | Ht 75.0 in | Wt 190.0 lb

## 2014-03-24 DIAGNOSIS — R059 Cough, unspecified: Secondary | ICD-10-CM

## 2014-03-24 DIAGNOSIS — F17200 Nicotine dependence, unspecified, uncomplicated: Secondary | ICD-10-CM

## 2014-03-24 DIAGNOSIS — Z720 Tobacco use: Secondary | ICD-10-CM

## 2014-03-24 DIAGNOSIS — R05 Cough: Secondary | ICD-10-CM

## 2014-03-24 DIAGNOSIS — J209 Acute bronchitis, unspecified: Secondary | ICD-10-CM

## 2014-03-24 MED ORDER — AZITHROMYCIN 250 MG PO TABS: ORAL_TABLET | ORAL | Status: DC

## 2014-03-24 NOTE — Patient Instructions (Signed)
Take Mucinex as directed Drink plenty of fluids Take antibiotic as directed Take Tylenol as needed for aches pains and fever Stop smoking Schedule appointment with clinical pharmacist if you are serious about this

## 2014-03-24 NOTE — Progress Notes (Signed)
Subjective:    Patient ID: Raymond Blake, male    DOB: Oct 06, 1960, 53 y.o.   MRN: [id number]  HPI Patient here today for cough and congestion, and hoarseness. This started about 1 and 1/2 weeks ago. The patient indicates that he has had some fever has been low-grade. He's been smoking for about 20 years, about one pack daily. He is 53 years old.        Patient Active Problem List   Diagnosis Date Noted  . Depression 08/25/2013  . Hypertension 07/05/2013  . Hyperlipidemia 07/05/2013   Outpatient Encounter Prescriptions as of 03/24/2014  Medication Sig  . atorvastatin (LIPITOR) 40 MG tablet TAKE 1 TABLET (40 MG TOTAL) BY MOUTH DAILY.  . clonazePAM (KLONOPIN) 1 MG tablet Take 1 tablet (1 mg total) by mouth 2 (two) times daily as needed for anxiety.  . valsartan (DIOVAN) 160 MG tablet Take 1 tablet (160 mg total) by mouth daily.  . [DISCONTINUED] losartan-hydrochlorothiazide (HYZAAR) 100-12.5 MG per tablet   . sildenafil (VIAGRA) 50 MG tablet Take 2 tablets (100 mg total) by mouth daily as needed for erectile dysfunction.  . [DISCONTINUED] citalopram (CELEXA) 20 MG tablet TAKE 1 TABLET (20 MG TOTAL) BY MOUTH DAILY.    Review of Systems  Constitutional: Positive for fever.  HENT: Positive for congestion.   Eyes: Negative.   Respiratory: Positive for cough and wheezing.   Cardiovascular: Negative.   Gastrointestinal: Negative.   Endocrine: Negative.   Genitourinary: Negative.   Musculoskeletal: Negative.   Skin: Negative.   Allergic/Immunologic: Negative.   Neurological: Negative.   Hematological: Negative.   Psychiatric/Behavioral: Negative.        Objective:   Physical Exam  Nursing note and vitals reviewed. Constitutional: He is oriented to person, place, and time. He appears well-developed and well-nourished. No distress.  HENT:  Head: Normocephalic and atraumatic.  Right Ear: External ear normal.  Left Ear: External ear normal.  Mouth/Throat: Oropharynx is clear  and moist. No oropharyngeal exudate.  There is some nasal congestion bilaterally  Eyes: Conjunctivae and EOM are normal. Pupils are equal, round, and reactive to light. Right eye exhibits no discharge. Left eye exhibits no discharge. No scleral icterus.  Neck: Normal range of motion. Neck supple. No thyromegaly present.  No adenopathy or carotid bruits  Cardiovascular: Normal rate, regular rhythm, normal heart sounds and intact distal pulses.  Exam reveals no gallop and no friction rub.   No murmur heard. At 60 per minute  Pulmonary/Chest: Effort normal and breath sounds normal. No respiratory distress. He has no wheezes. He has no rales. He exhibits no tenderness.  No axillary adenopathy, patient has a dry cough  Abdominal: Soft. Bowel sounds are normal. He exhibits no mass. There is no tenderness. There is no rebound and no guarding.  Musculoskeletal: Normal range of motion. He exhibits no edema.  Lymphadenopathy:    He has no cervical adenopathy.  Neurological: He is alert and oriented to person, place, and time.  Skin: Skin is warm and dry. No rash noted. No erythema. No pallor.  Psychiatric: He has a normal mood and affect. His behavior is normal. Judgment and thought content normal.   BP 142/82  Pulse 60  Temp(Src) 98.5 F (36.9 C) (Oral)  Ht  (1.905 m)  Wt 190 lb (86.183 kg)  BMI 23.75 kg/m2  [facility] reading (PRIMARY) by  Dr. Christell Constant- chest x-ray-  no active disease  Assessment & Plan:  1. Cough - DG Chest 2 View; Future  2. Acute bronchitis, unspecified organism  3. Nicotine abuse Meds ordered this encounter  Medications  . DISCONTD: losartan-hydrochlorothiazide (HYZAAR) 100-12.5 MG per tablet    Sig:   . azithromycin (ZITHROMAX) 250 MG tablet    Sig: 2 on the first dose then 1 daily for 4days    Dispense:  6 tablet    Refill:  0   Patient Instructions  Take Mucinex as directed Drink plenty of fluids Take antibiotic as  directed Take Tylenol as needed for aches pains and fever Stop smoking Schedule appointment with clinical pharmacist if you are serious about this   Nyra Capes MD

## 2014-03-24 NOTE — Telephone Encounter (Signed)
appt made for todAY

## 2014-03-30 ENCOUNTER — Telehealth: Payer: Self-pay | Admitting: Family Medicine

## 2014-03-30 NOTE — Telephone Encounter (Signed)
Please let patient know that this medication is working for a total of 10 days which would be 5 days after he completed the medication. Have him to continue to drink plenty of fluids and take cough medicine on a regular basis if he is not any better 10 days after starting the medication and then we can look at trying a different antibiotic

## 2014-03-31 NOTE — Telephone Encounter (Signed)
Patient aware.

## 2014-04-06 ENCOUNTER — Telehealth: Payer: Self-pay

## 2014-04-06 NOTE — Telephone Encounter (Signed)
Message copied by Roselee Culver on Wed Apr 06, 2014  8:50 AM ------      Message from: Ernestina Penna      Created: Tue Apr 05, 2014  5:41 PM       As per radiology report ------

## 2014-04-06 NOTE — Telephone Encounter (Signed)
Pt aware of CXR results.

## 2014-05-05 ENCOUNTER — Encounter: Payer: Self-pay | Admitting: Family Medicine

## 2014-05-05 ENCOUNTER — Ambulatory Visit (INDEPENDENT_AMBULATORY_CARE_PROVIDER_SITE_OTHER): Payer: 59 | Admitting: Family Medicine

## 2014-05-05 VITALS — BP 133/80 | HR 78 | Temp 98.7°F | Ht 75.0 in | Wt 188.2 lb

## 2014-05-05 DIAGNOSIS — R490 Dysphonia: Secondary | ICD-10-CM

## 2014-05-05 DIAGNOSIS — I10 Essential (primary) hypertension: Secondary | ICD-10-CM

## 2014-05-05 DIAGNOSIS — E785 Hyperlipidemia, unspecified: Secondary | ICD-10-CM

## 2014-05-05 NOTE — Progress Notes (Signed)
   Subjective:    Patient ID: Raymond Blake, male    DOB: 22-Nov-1960, 53 y.o.   MRN: [id number]  HPI 53 year old gentleman who is here to followup bronchitis. He was treated 5 weeks ago with azithromycin but has a persistent cough and hoarseness. The cough is not productive of colored sputum he denies any postnasal drainage. He does smoke.    Review of Systems  HENT: Positive for voice change.   Respiratory: Positive for cough. Negative for choking.   All other systems reviewed and are negative.      Objective:   Physical Exam  Constitutional: He is oriented to person, place, and time. He appears well-developed and well-nourished.  HENT:  Head: Normocephalic.  Right Ear: External ear normal.  Left Ear: External ear normal.  Nose: Nose normal.  Mouth/Throat: Oropharynx is clear and moist.  Eyes: Conjunctivae and EOM are normal. Pupils are equal, round, and reactive to light.  Neck: Normal range of motion. Neck supple.  Cardiovascular: Normal rate, regular rhythm, normal heart sounds and intact distal pulses.   Pulmonary/Chest: Effort normal and breath sounds normal.  Abdominal: Soft. Bowel sounds are normal.  Musculoskeletal: Normal range of motion.  Neurological: He is alert and oriented to person, place, and time.  Skin: Skin is warm and dry.  Psychiatric: He has a normal mood and affect. His behavior is normal. Judgment and thought content normal.    BP 133/80  Pulse 78  Temp(Src) 98.7 F (37.1 C) (Oral)  Ht 6\' 3"  (1.905 m)  Wt 188 lb 3.2 oz (85.367 kg)  BMI 23.52 kg/m2      Assessment & Plan:  1. Essential hypertension   2. Hyperlipidemia   3. Hoarseness of voice Persistent symptoms after bronchitis suggests post respiratory syndrome. Will try steroid inhaler; if not improved, ENT referral  Frederica KusterStephen M Toleen Lachapelle MD

## 2014-05-05 NOTE — Patient Instructions (Signed)
Hoarseness Hoarseness is produced from a variety of causes. It is important to find the cause so it can be treated. In the absence of a cold or upper respiratory illness, any hoarseness lasting more than 2 weeks should be looked at by a specialist. This is especially important if you have a history of smoking or alcohol use. It is also important to keep in mind that as you grow older, your voice will naturally get weaker, making it easier for you to become hoarse from straining your vocal cords.  CAUSES  Any illness that affects your vocal cords can result in a hoarse voice. Examples of conditions that can affect the vocal cords are listed as follows:   Allergies.  Colds.  Sinusitis.  Gastroesophageal reflux disease.  Croup.  Injury.  Nodules.  Exposure to smoke or toxic fumes or gases.  Congenital and genetic defects.  Paralysis of the vocal cords.  Infections.  Advanced age. DIAGNOSIS  In order to diagnose the cause of your hoarseness, your caregiver will examine your throat using an instrument that uses a tube with a small lighted camera (laryngoscope). It allows your caregiver to look into the mouth and down the throat. TREATMENT  For most cases, treatment will focus on the specific cause of the hoarseness. Depending on the cause, hoarseness can be a temporary condition (acute) or it can be long lasting (chronic). Most cases of hoarseness clear up without complications. Your caregiver will explain to you if this is not likely to happen. SEEK IMMEDIATE MEDICAL CARE IF:   You have increasing hoarseness or loss of voice.  You have shortness of breath.  You are coughing up blood.  There is pain in your neck or throat. Document Released: 06/14/2005 Document Revised: 09/23/2011 Document Reviewed: 09/06/2010 [employer] Patient Information 2015 [employer]. This information is not intended to replace advice given to you by your health care provider. Make sure you discuss any  questions you have with your health care provider.  

## 2014-05-18 ENCOUNTER — Ambulatory Visit: Payer: 59 | Admitting: Nurse Practitioner

## 2014-05-31 ENCOUNTER — Other Ambulatory Visit: Payer: Self-pay | Admitting: Nurse Practitioner

## 2014-06-03 ENCOUNTER — Other Ambulatory Visit: Payer: Self-pay | Admitting: Nurse Practitioner

## 2014-06-03 NOTE — Telephone Encounter (Signed)
Both were sent from surescripts? Cant get in touch with pt

## 2014-06-03 NOTE — Telephone Encounter (Signed)
Please call in klonopin with 1 refills 

## 2014-06-03 NOTE — Telephone Encounter (Signed)
rx called into pharmacy

## 2014-08-10 ENCOUNTER — Ambulatory Visit: Payer: 59 | Admitting: Nurse Practitioner

## 2015-02-22 IMAGING — CR DG CHEST 2V
2 series · 2 of 2 positions shown · non-contrast
Comparison: None.

CLINICAL DATA: Hypertension.

EXAM:
CHEST  2 VIEW

[view not recorded (1 of 2)]
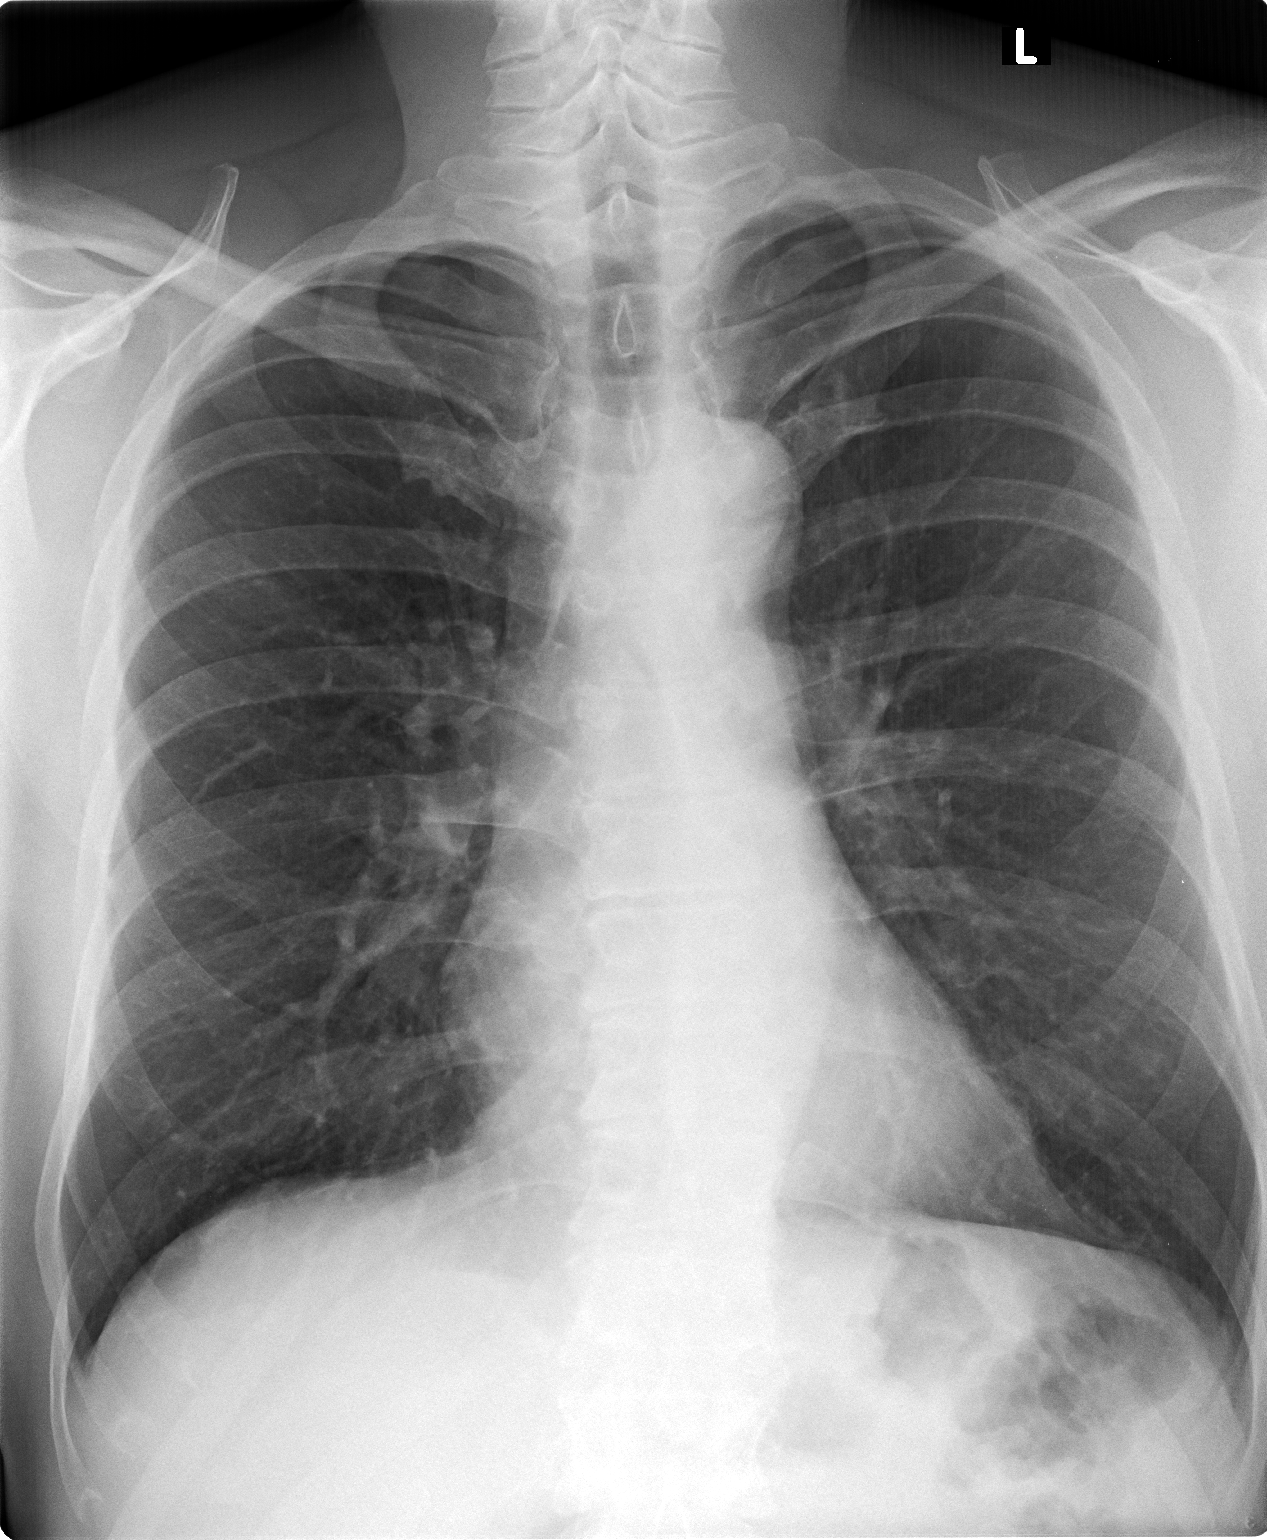

[view not recorded (2 of 2)]
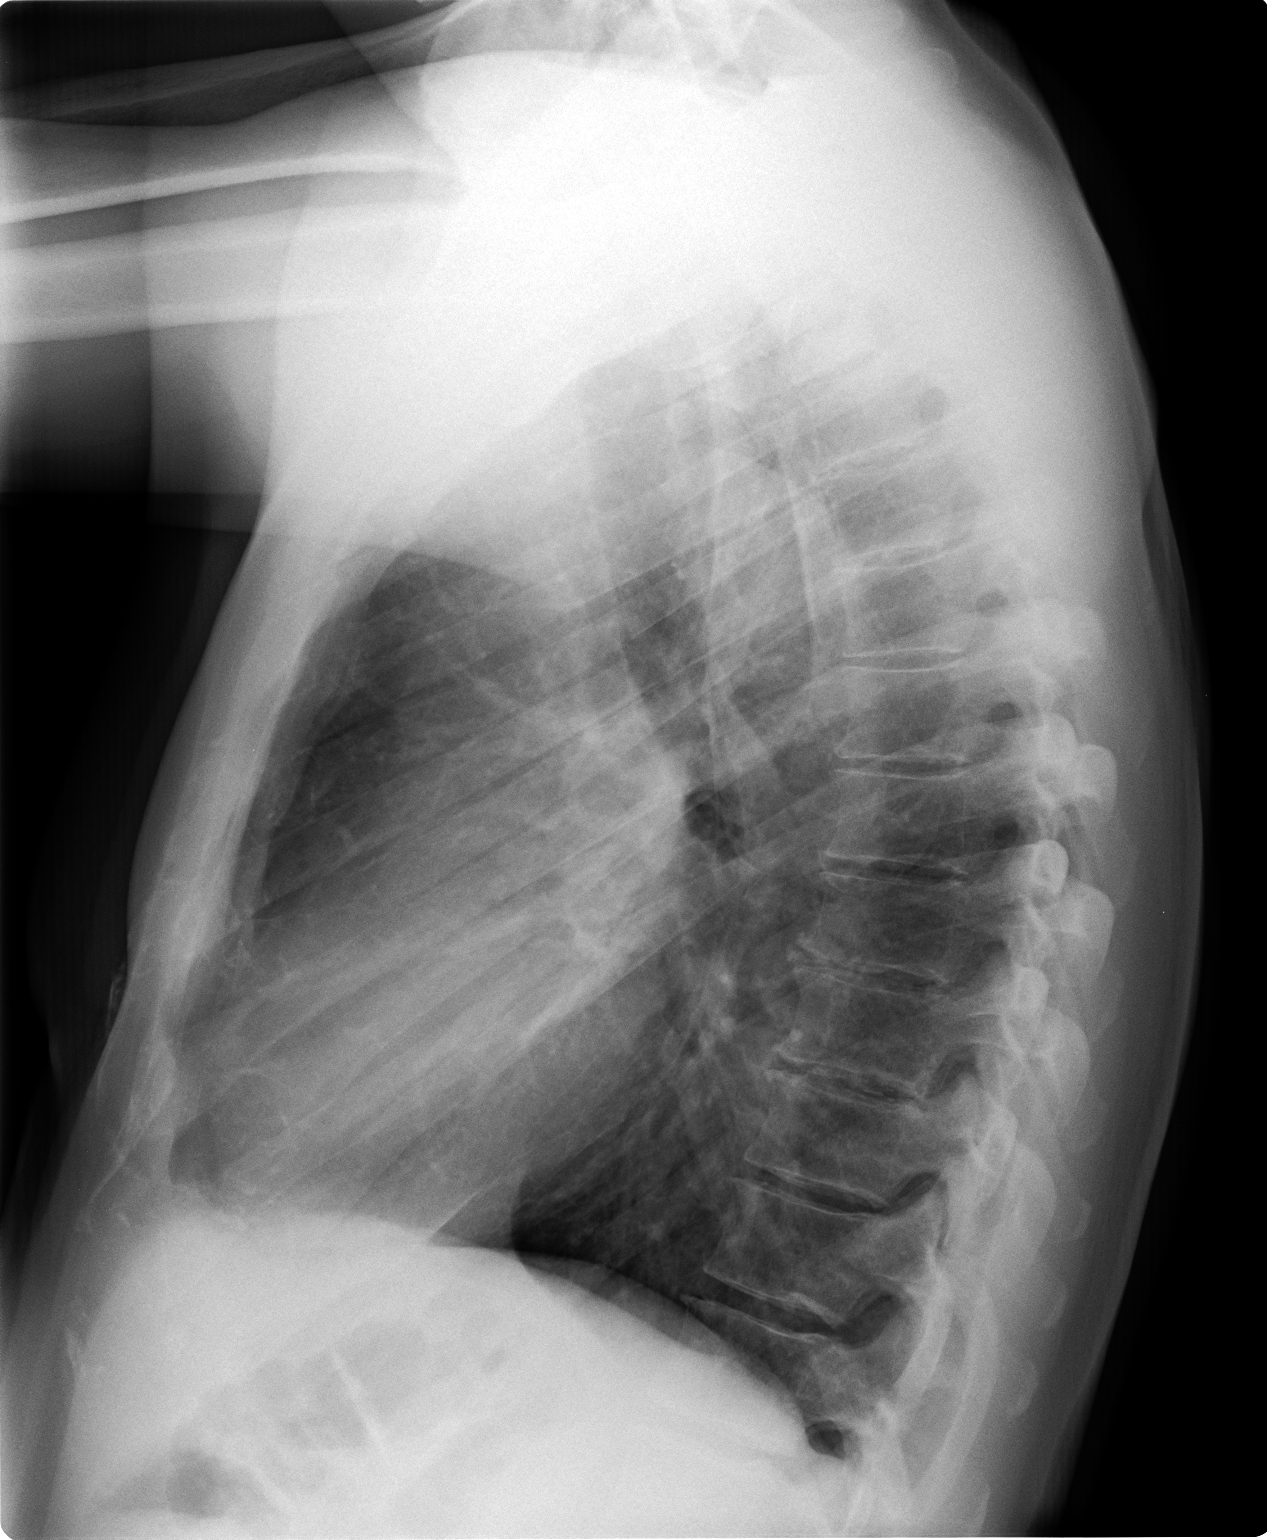

[2 of 2 positions shown; findings below may reference images not displayed]

FINDINGS: The heart size and mediastinal contours are within normal limits.
Both lungs are clear. The visualized skeletal structures are
unremarkable.
IMPRESSION: No active cardiopulmonary disease.
